# Patient Record
Sex: Male | Born: 1970 | Race: Black or African American | Hispanic: No | Marital: Single | Smoking: Current every day smoker
Health system: Southern US, Community
[De-identification: ages and names within clinical notes are randomized; demographics above are authoritative.]

## PROBLEM LIST (undated history)

## (undated) HISTORY — PX: FOOT SURGERY: SHX648

---

## 2005-01-10 ENCOUNTER — Emergency Department (HOSPITAL_COMMUNITY): Admission: EM | Admit: 2005-01-10 | Discharge: 2005-01-10 | Payer: Self-pay | Admitting: Family Medicine

## 2008-01-29 ENCOUNTER — Ambulatory Visit: Payer: Self-pay | Admitting: Family Medicine

## 2008-01-29 ENCOUNTER — Inpatient Hospital Stay (HOSPITAL_COMMUNITY): Admission: EM | Admit: 2008-01-29 | Discharge: 2008-02-01 | Payer: Self-pay | Admitting: Emergency Medicine

## 2009-02-02 ENCOUNTER — Emergency Department (HOSPITAL_COMMUNITY): Admission: EM | Admit: 2009-02-02 | Discharge: 2009-02-02 | Payer: Self-pay | Admitting: Emergency Medicine

## 2010-11-15 NOTE — H&P (Signed)
NAME:  Jon Schneider, Jon Schneider NO.:  [id number]   MEDICAL RECORD NO.:  [id number]          PATIENT TYPE:  EMS   LOCATION:  MAJO                         FACILITY:  [facility]   PHYSICIAN:  Alvy Beal, MD    DATE OF BIRTH:  1971/02/11   DATE OF ADMISSION:  01/28/2008  DATE OF DISCHARGE:                              HISTORY & PHYSICAL   REASON FOR CONSULTATION:  Right foot injury.   HISTORY:  Jon Schneider is a very pleasant 40 year old gentleman who  yesterday early afternoon was playing basketball.  A large man came,  rebounded the ball, and landed on top of his right foot, and he noted  immediate pain and swelling.  He continued to play and was ambulating on  the foot with increasing difficulty this evening, because of ongoing  pain and swelling, he presented to the emergency room.  He states that  the worst his pain was about 5 or 6.  He never had any numbness, but  just a progressive swelling, had no point that he really has the leg  elevated, he kept in defended position, and he is even still trying to  ambulate on it.  X-rays were taken here in the emergency room and I was  contacted because of the presence of a Lisfranc fracture-dislocation of  the right foot.   At this point in time upon arrival to the emergency room, he was now  anywhere from 36-48 hours status post the injury.   PAST MEDICAL/SURGICAL/FAMILY/SOCIAL HISTORY:  The patient is a  historian.  He provides an excellent history.  He is alert and oriented  x3.  He denies any significant medical problems.  He smokes rarely, he  does use cannabis rarely, and he drinks rarely.   He is on no current medications.   He has no known drug allergies.   A 14-point review of systems is unremarkable.  He has no history of  diabetes, hypertension, high blood pressure, or previous surgeries.   CLINICAL EXAM:  He is currently resting.  The foot is not immobilized.  He has no calf pain or tenderness.  He has no knee  pain with range of  motion or palpation.  Hip is also nontender with gentle, internal, and  external rotation.  He has no shortness of breath or chest pain.  Abdomen is soft and nontender.  No history of incontinence of bowel and  bladder.  Cranial nerves II-XII were tested, all were intact.  Left  lower extremity is benign with no gross deformity, crepitus, or pain  with ankle, knee, or hip range of motion.  Evaluation of the right foot  reveals significant swelling.  I cannot compressive forefoot, midfoot,  and hindfoot and it does not elicit horrific pain.  It is soft, swollen,  but still soft.  I can passively move all five toes without eliciting  significant pain.  His sensation to light touch is intact throughout  both the dorsum and sole of the foot and he is able to move all of his  toes.   Capillary refill is less than 2 seconds in all  digits.  He has a  palpable dorsalis pedis, posterior tibialis pulse.   The skin itself is intact.  There is no significant blistering.  No  evidence of any tenting of the skin.   The x-rays confirm a divergent Lisfranc fracture-dislocation.   PLAN:  At this point, there is no evidence of acute compartment syndrome  of the foot or calf.  He is neurovascularly intact and there is no  evidence of this being an open injury or that the skin is in danger of  opening.  At this point, given the complexity of his fracture, I think  the best course of action is to admit him, immobilize the extremity in a  splint and ice it.  I will discuss with my orthopedic colleagues about  definitive fracture management.  I explained to the patient that this is  very significant injury and it has already been 36-48 hours since the  injury and that recovery from this is difficult.  I explained this to  the patient and to his significant other.  All the questions were  encouraged and addressed.      Alvy Beal, MD  Electronically Signed     DDB/MEDQ  D:   01/29/2008  T:  01/29/2008  Job:  [id number]

## 2010-11-15 NOTE — Op Note (Signed)
NAME:  Jon Schneider, Jon Schneider NO.:  [id number]   MEDICAL RECORD NO.:  [id number]          PATIENT TYPE:  INP   LOCATION:  3307                         FACILITY:  [facility]   PHYSICIAN:  Nadara Mustard, MD     DATE OF BIRTH:  1971-02-06   DATE OF PROCEDURE:  01/30/2008  DATE OF DISCHARGE:                               OPERATIVE REPORT   PREOPERATIVE DIAGNOSIS:  Divergent Lisfranc fracture-dislocation, right  foot.   POSTOPERATIVE DIAGNOSIS:  Divergent Lisfranc fracture-dislocation, right  foot.   PROCEDURE:  Open reduction and internal fixation, right Lisfranc  fracture.   SURGEON:  Nadara Mustard, MD   ANESTHESIA:  General.   ESTIMATED BLOOD LOSS:  Minimal.   ANTIBIOTICS:  1 gram of Kefzol.   DRAINS:  None.   COMPLICATIONS:  None.   TOURNIQUET TIME:  Esmarch at the ankle for approximately 43 minutes.   DISPOSITION:  To PACU in stable condition.   INDICATIONS FOR PROCEDURE:  The patient is a 40 year old gentleman who  sustained a closed Lisfranc fracture-dislocation divergent with the  medial column dislocated and displaced medially and the four lateral  metatarsals divergent laterally.  The patient was initially evaluated  and was neurovascularly intact and no evidence of compartment syndrome.  He was placed in a bulky splint and presents at this time for surgical  intervention.  Risks and benefits were discussed including infection,  neurovascular injury, persistent pain, breakdown of the wounds, and need  for additional surgery.  The patient states that he understands and  wished proceed at this time.   DESCRIPTION OF PROCEDURE:  The patient was brought to the OR room-4 and  underwent general anesthetic.  After adequate level of anesthesia was  obtained, the patient's right lower extremity was prepped using DuraPrep  and draped in a sterile field.  An [name] was used to cover all exposed  skin.  A first dorsal incision was made in the webspace between the  first and second metatarsals.  This was bluntly carried down, first the  base of the first metatarsal and the medial cuneiform.  The joint was  cleansed, reduced, and then stabilized with a screw from distal dorsal  to plantar proximal.  This stabilized the medial column.  Using the  tenaculum clamp, the second metatarsal was reduced and was brought over  to the first metatarsal and a second screw was placed from the medial  cuneiform into the base of the second metatarsal.  Attention was then  focused over the lateral column and a second dorsal incision was made  over the fifth metatarsal.  Blunt dissection was carried down to the  base of the fourth and fifth metatarsals.  These were both reduced and  then the two joints were both each end with a 1.6 mm K-wire to stabilize  the base of the fourth and fifth metatarsals.  Radiographs showed stable  alignment in both AP and lateral planes.  The wounds were irrigated with normal saline.  The skin incision was  closed without tension on the skin with a far-near-near-far suture with  both incisions closed.  The wounds  were covered with Adaptic, orthopedic  sponges, sterile Webril, and Coban dressing.  The patient was extubated  and taken to PACU in stable condition.      Nadara Mustard, MD  Electronically Signed     MVD/MEDQ  D:  01/30/2008  T:  01/31/2008  Job:  [id number]

## 2010-11-18 NOTE — Discharge Summary (Signed)
NAME:  Jon Schneider, Jon Schneider NO.:  [id number]   MEDICAL RECORD NO.:  [id number]          PATIENT TYPE:  INP   LOCATION:  4155                         FACILITY:  [facility]   PHYSICIAN:  Nadara Mustard, MD     DATE OF BIRTH:  04-08-1971   DATE OF ADMISSION:  01/28/2008  DATE OF DISCHARGE:  02/01/2008                               DISCHARGE SUMMARY   FINAL DIAGNOSIS:  Lisfranc fracture-dislocation, right foot.   PROCEDURE:  Open reduction and internal fixation, right foot.   Discharged to home in stable condition.   Follow up in the office in 2 weeks.   HISTORY OF PRESENT ILLNESS:  The patient is a 40 year old gentleman who  sustained a traumatic Lisfranc fracture-dislocation to the right foot.  He was admitted.  Consultation was obtained for possible surgical  intervention, and due to the unstable nature of his foot, the patient  was up brought to the OR for open reduction and internal fixation of the  right Lisfranc fracture.  The patient underwent open reduction and  internal fixation of the right mid foot Lisfranc fracture on January 30, 2008.  He received Kefzol for infection prophylaxis, Esmarch to the  ankle for approximately 43 minutes.  Postoperatively, the patient  progressed well.  He was nonweightbearing on the right.  He was able to  progress with physical therapy where he was able to be nonweightbearing,  and he was discharged to home in stable condition on February 01, 2008,  with followup in the office in 2 weeks.      Nadara Mustard, MD  Electronically Signed     MVD/MEDQ  D:  04/22/2008  T:  04/22/2008  Job:  [phone] [id number]

## 2011-03-31 LAB — BASIC METABOLIC PANEL
BUN: 14
CO2: 29
Calcium: 8.6
Creatinine, Ser: 1.26
GFR calc non Af Amer: >60
Glucose, Bld: 116 — ABNORMAL HIGH
Potassium: 3.5

## 2011-03-31 LAB — DIFFERENTIAL
Basophils Relative: 0
Eosinophils Absolute: 0.2
Eosinophils Relative: 2
Lymphocytes Relative: 18
Lymphs Abs: 2
Monocytes Absolute: 0.8
Monocytes Relative: 7
Neutro Abs: 8.1 — ABNORMAL HIGH

## 2011-03-31 LAB — CBC
HCT: 34.4 — ABNORMAL LOW
Hemoglobin: 11.4 — ABNORMAL LOW
RBC: 4 — ABNORMAL LOW
RBC: 3.73 — ABNORMAL LOW
RDW: 15.3
WBC: 13.1 — ABNORMAL HIGH

## 2011-03-31 LAB — APTT: aPTT: 30

## 2011-03-31 LAB — TYPE AND SCREEN
ABO/RH(D): O POS
Antibody Screen: NEGATIVE

## 2011-03-31 LAB — POCT I-STAT, CHEM 8
HCT: 40
Hemoglobin: 13.6
TCO2: 28

## 2011-03-31 LAB — ABO/RH: ABO/RH(D): O POS

## 2011-03-31 LAB — PROTIME-INR: INR: 1

## 2014-05-26 ENCOUNTER — Encounter (HOSPITAL_COMMUNITY): Payer: Self-pay | Admitting: *Deleted

## 2014-05-26 ENCOUNTER — Emergency Department (HOSPITAL_COMMUNITY)
Admission: EM | Admit: 2014-05-26 | Discharge: 2014-05-26 | Disposition: A | Discharge status: Home / Self Care | Payer: Self-pay | Attending: Emergency Medicine | Admitting: Emergency Medicine

## 2014-05-26 DIAGNOSIS — B353 Tinea pedis: Secondary | ICD-10-CM | POA: Insufficient documentation

## 2014-05-26 DIAGNOSIS — Z9889 Other specified postprocedural states: Secondary | ICD-10-CM | POA: Insufficient documentation

## 2014-05-26 DIAGNOSIS — Z720 Tobacco use: Secondary | ICD-10-CM | POA: Insufficient documentation

## 2014-05-26 MED ORDER — TERBINAFINE HCL 250 MG PO TABS: 250.0000 mg | ORAL_TABLET | Freq: Every day | ORAL | Status: DC

## 2014-05-26 NOTE — Discharge Instructions (Signed)

## 2014-05-26 NOTE — ED Notes (Signed)
C/o constant pain and stinging between 4th & 5th toes on L foot, skin not intact, open wound noted. (denies: known injury, nv, cramping, fever, burning or itching). No meds PTA. Has tried foot powder and Dr. Margart SicklesScholl's anti-fungal foot spray.  Onset ~ 3 weeks ago. Admits to sx worsening after playing in a creek. Wears steel toed shoes at work.

## 2014-05-26 NOTE — ED Provider Notes (Signed)
CSN: [id number]     Arrival date & time 05/26/14  0450 History   First MD Initiated Contact with Patient 05/26/14 [phone]     Chief Complaint  Patient presents with  . Foot Pain     (Consider location/radiation/quality/duration/timing/severity/associated sxs/prior Treatment) HPI  Pt is a 43yo male c/o left foot rash with itching for 3 weeks, gradually worsening.  Pt states he tried foot powder and Dr. Margart SicklesScholl's anti-fungal spay w/o relief.  Pt does admit symptoms worsened after playing in the creek with his son the other day. Pt does wear steel toed shoes.  No hx of diabetes. No injury to foot.  Denies hx of similar symptoms. No fever, n/v/d.   History reviewed. No pertinent past medical history. Past Surgical History  Procedure Laterality Date  . Foot surgery Right    No family history on file. History  Substance Use Topics  . Smoking status: Current Every Day Smoker  . Smokeless tobacco: Not on file  . Alcohol Use: Yes    Review of Systems  Constitutional: Negative for fever and chills.  Musculoskeletal: Positive for joint swelling. Negative for myalgias and arthralgias.       Left 4th and 5th toe swelling   Skin: Positive for color change, rash and wound. Negative for pallor.       Skin between 4th and 5th left toe      Allergies  Review of patient's allergies indicates no known allergies.  Home Medications   Prior to Admission medications   Medication Sig Start Date End Date Taking? Authorizing Provider  terbinafine (LAMISIL) 250 MG tablet Take 1 tablet (250 mg total) by mouth daily. Daily 1 tab daily for 4 weeks 05/26/14   Junius FinnerErin O'Malley, PA-C   BP 122/76 mmHg  Pulse 60  Temp(Src) 97.5 F (36.4 C) (Oral)  Resp 22  Ht 5\' 9"  (1.753 m)  Wt 186 lb (84.369 kg)  BMI 27.45 kg/m2  SpO2 100% Physical Exam  Constitutional: He is oriented to person, place, and time. He appears well-developed and well-nourished.  HENT:  Head: Normocephalic and atraumatic.  Eyes: EOM are  normal.  Neck: Normal range of motion.  Cardiovascular: Normal rate.   Pulmonary/Chest: Effort normal.  Musculoskeletal: Normal range of motion. He exhibits tenderness. He exhibits no edema.  Tenderness between left 4th and 5th toes, see skin exam  Neurological: He is alert and oriented to person, place, and time.  Skin: Skin is warm and dry.  Web spacing between 4th and 5th toes-white, thick, flaking skin, centralized depression in skin. Tenderness. No erythema, warmth or discharge.  Psychiatric: He has a normal mood and affect. His behavior is normal.  Nursing note and vitals reviewed.   ED Course  Procedures (including critical care time) Labs Review Labs Reviewed - No data to display  Imaging Review No results found.   EKG Interpretation None      MDM   Final diagnoses:  Tinea pedis of left foot   Pt c/o left foot itching and rash between 4th and 5th toes, started 3 weeks ago. Rash is consistent with tinea pedis. No evidence of underlying cellulitis or abscess. Will tx with PO terbinafine x4 weeks. Advised pt to f/u with [facility] in 2-3 weeks if not improving, or symptoms continue to worsen. Pt verbalized understanding and agreement with tx plan.     Junius Finnerrin O'Malley, PA-C 05/26/14 [id number]  Loren Raceravid Yelverton, MD 06/03/14 [phone]

## 2015-09-24 ENCOUNTER — Emergency Department (HOSPITAL_COMMUNITY)
Admission: EM | Admit: 2015-09-24 | Discharge: 2015-09-24 | Disposition: A | Discharge status: Home / Self Care | Payer: Self-pay | Attending: Emergency Medicine | Admitting: Emergency Medicine

## 2015-09-24 ENCOUNTER — Encounter (HOSPITAL_COMMUNITY): Payer: Self-pay

## 2015-09-24 ENCOUNTER — Emergency Department (HOSPITAL_COMMUNITY): Payer: No Typology Code available for payment source

## 2015-09-24 DIAGNOSIS — S0592XA Unspecified injury of left eye and orbit, initial encounter: Secondary | ICD-10-CM | POA: Insufficient documentation

## 2015-09-24 DIAGNOSIS — R61 Generalized hyperhidrosis: Secondary | ICD-10-CM | POA: Insufficient documentation

## 2015-09-24 DIAGNOSIS — S4992XA Unspecified injury of left shoulder and upper arm, initial encounter: Secondary | ICD-10-CM | POA: Insufficient documentation

## 2015-09-24 DIAGNOSIS — Y998 Other external cause status: Secondary | ICD-10-CM | POA: Insufficient documentation

## 2015-09-24 DIAGNOSIS — F17210 Nicotine dependence, cigarettes, uncomplicated: Secondary | ICD-10-CM | POA: Insufficient documentation

## 2015-09-24 DIAGNOSIS — Y92410 Unspecified street and highway as the place of occurrence of the external cause: Secondary | ICD-10-CM | POA: Insufficient documentation

## 2015-09-24 DIAGNOSIS — Y9389 Activity, other specified: Secondary | ICD-10-CM | POA: Insufficient documentation

## 2015-09-24 DIAGNOSIS — S0990XA Unspecified injury of head, initial encounter: Secondary | ICD-10-CM | POA: Insufficient documentation

## 2015-09-24 DIAGNOSIS — S199XXA Unspecified injury of neck, initial encounter: Secondary | ICD-10-CM | POA: Insufficient documentation

## 2015-09-24 MED ORDER — IBUPROFEN 600 MG PO TABS: 600.0000 mg | ORAL_TABLET | Freq: Four times a day (QID) | ORAL | Status: DC | PRN

## 2015-09-24 MED ORDER — IBUPROFEN 800 MG PO TABS
800.0000 mg | ORAL_TABLET | Freq: Once | ORAL | Status: AC
  Administered 2015-09-24: 800 mg via ORAL
  Filled 2015-09-24: qty 1

## 2015-09-24 NOTE — Discharge Instructions (Signed)
Ibuprofen for pain. Ice your face several times a day. Follow up with a family doctor. Your CT scans are normal today.   Head Injury, Adult You have received a head injury. It does not appear serious at this time. Headaches and vomiting are common following head injury. It should be easy to awaken from sleeping. Sometimes it is necessary for you to stay in the emergency department for a while for observation. Sometimes admission to the hospital may be needed. After injuries such as yours, most problems occur within the first 24 hours, but side effects may occur up to 7-10 days after the injury. It is important for you to carefully monitor your condition and contact your health care provider or seek immediate medical care if there is a change in your condition. WHAT ARE THE TYPES OF HEAD INJURIES? Head injuries can be as minor as a bump. Some head injuries can be more severe. More severe head injuries include:  A jarring injury to the brain (concussion).  A bruise of the brain (contusion). This mean there is bleeding in the brain that can cause swelling.  A cracked skull (skull fracture).  Bleeding in the brain that collects, clots, and forms a bump (hematoma). WHAT CAUSES A HEAD INJURY? A serious head injury is most likely to happen to someone who is in a car wreck and is not wearing a seat belt. Other causes of major head injuries include bicycle or motorcycle accidents, sports injuries, and falls. HOW ARE HEAD INJURIES DIAGNOSED? A complete history of the event leading to the injury and your current symptoms will be helpful in diagnosing head injuries. Many times, pictures of the brain, such as CT or MRI are needed to see the extent of the injury. Often, an overnight hospital stay is necessary for observation.  WHEN SHOULD I SEEK IMMEDIATE MEDICAL CARE?  You should get help right away if:  You have confusion or drowsiness.  You feel sick to your stomach (nauseous) or have continued, forceful  vomiting.  You have dizziness or unsteadiness that is getting worse.  You have severe, continued headaches not relieved by medicine. Only take over-the-counter or prescription medicines for pain, fever, or discomfort as directed by your health care provider.  You do not have normal function of the arms or legs or are unable to walk.  You notice changes in the black spots in the center of the colored part of your eye (pupil).  You have a clear or bloody fluid coming from your nose or ears.  You have a loss of vision. During the next 24 hours after the injury, you must stay with someone who can watch you for the warning signs. This person should contact local emergency services (911 in the [place] if you have seizures, you become unconscious, or you are unable to wake up. HOW CAN I PREVENT A HEAD INJURY IN THE FUTURE? The most important factor for preventing major head injuries is avoiding motor vehicle accidents. To minimize the potential for damage to your head, it is crucial to wear seat belts while riding in motor vehicles. Wearing helmets while bike riding and playing collision sports (like football) is also helpful. Also, avoiding dangerous activities around the house will further help reduce your risk of head injury.  WHEN CAN I RETURN TO NORMAL ACTIVITIES AND ATHLETICS? You should be reevaluated by your health care provider before returning to these activities. If you have any of the following symptoms, you should not return to activities or  contact sports until 1 week after the symptoms have stopped:  Persistent headache.  Dizziness or vertigo.  Poor attention and concentration.  Confusion.  Memory problems.  Nausea or vomiting.  Fatigue or tire easily.  Irritability.  Intolerant of bright lights or loud noises.  Anxiety or depression.  Disturbed sleep. MAKE SURE YOU:   Understand these instructions.  Will watch your condition.  Will get help right away if you are  not doing well or get worse.   This information is not intended to replace advice given to you by your health care provider. Make sure you discuss any questions you have with your health care provider.   Document Released: 06/19/2005 Document Revised: 07/10/2014 Document Reviewed: 02/24/2013 Elsevier Interactive Patient Education [employer]

## 2015-09-24 NOTE — ED Notes (Signed)
Patient transported to CT 
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
## 2015-09-24 NOTE — ED Notes (Signed)
Bed: [id number] Expected date:  Expected time:  Means of arrival:  Comments: EMS- SOB/Fever/Hx of COPD

## 2015-09-24 NOTE — ED Notes (Signed)
Patient left prior to receiving discharge instructions. 

## 2015-12-04 ENCOUNTER — Emergency Department (HOSPITAL_COMMUNITY): Payer: No Typology Code available for payment source

## 2015-12-04 ENCOUNTER — Encounter (HOSPITAL_COMMUNITY): Payer: Self-pay | Admitting: Nurse Practitioner

## 2015-12-04 ENCOUNTER — Emergency Department (HOSPITAL_COMMUNITY)
Admission: EM | Admit: 2015-12-04 | Discharge: 2015-12-04 | Disposition: A | Discharge status: Home / Self Care | Payer: No Typology Code available for payment source | Attending: Emergency Medicine | Admitting: Emergency Medicine

## 2015-12-04 DIAGNOSIS — Z791 Long term (current) use of non-steroidal anti-inflammatories (NSAID): Secondary | ICD-10-CM | POA: Insufficient documentation

## 2015-12-04 DIAGNOSIS — N503 Cyst of epididymis: Secondary | ICD-10-CM | POA: Insufficient documentation

## 2015-12-04 DIAGNOSIS — Z79899 Other long term (current) drug therapy: Secondary | ICD-10-CM | POA: Insufficient documentation

## 2015-12-04 DIAGNOSIS — R52 Pain, unspecified: Secondary | ICD-10-CM

## 2015-12-04 DIAGNOSIS — F17210 Nicotine dependence, cigarettes, uncomplicated: Secondary | ICD-10-CM | POA: Insufficient documentation

## 2015-12-04 DIAGNOSIS — N453 Epididymo-orchitis: Secondary | ICD-10-CM

## 2015-12-04 DIAGNOSIS — N433 Hydrocele, unspecified: Secondary | ICD-10-CM | POA: Insufficient documentation

## 2015-12-04 DIAGNOSIS — N390 Urinary tract infection, site not specified: Secondary | ICD-10-CM | POA: Insufficient documentation

## 2015-12-04 LAB — URINE MICROSCOPIC-ADD ON

## 2015-12-04 LAB — URINALYSIS, ROUTINE W REFLEX MICROSCOPIC
Bilirubin Urine: NEGATIVE
GLUCOSE, UA: NEGATIVE mg/dL
Ketones, ur: NEGATIVE mg/dL
Nitrite: POSITIVE — AB
Protein, ur: NEGATIVE mg/dL
SPECIFIC GRAVITY, URINE: 1.024 (ref 1.005–1.030)
pH: 5.5 (ref 5.0–8.0)

## 2015-12-04 MED ORDER — CEFTRIAXONE SODIUM 250 MG IJ SOLR
250.0000 mg | Freq: Once | INTRAMUSCULAR | Status: AC
Start: 2015-12-04 — End: 2015-12-04
  Administered 2015-12-04: 250 mg via INTRAMUSCULAR
  Filled 2015-12-04: qty 250

## 2015-12-04 MED ORDER — CEPHALEXIN 250 MG PO CAPS
500.0000 mg | ORAL_CAPSULE | Freq: Once | ORAL | Status: AC
  Administered 2015-12-04: 500 mg via ORAL
  Filled 2015-12-04: qty 2

## 2015-12-04 MED ORDER — CEPHALEXIN 500 MG PO CAPS: 500.0000 mg | ORAL_CAPSULE | Freq: Two times a day (BID) | ORAL | Status: DC

## 2015-12-04 MED ORDER — CEPHALEXIN 250 MG PO CAPS: 500.0000 mg | ORAL_CAPSULE | Freq: Once | ORAL | Status: DC

## 2015-12-04 MED ORDER — KETOROLAC TROMETHAMINE 30 MG/ML IJ SOLN
30.0000 mg | Freq: Once | INTRAMUSCULAR | Status: AC
  Administered 2015-12-04: 30 mg via INTRAMUSCULAR
  Filled 2015-12-04: qty 1

## 2015-12-04 MED ORDER — DOXYCYCLINE HYCLATE 100 MG PO TABS: 100.0000 mg | ORAL_TABLET | Freq: Two times a day (BID) | ORAL | Status: DC

## 2015-12-04 MED ORDER — LIDOCAINE HCL (PF) 1 % IJ SOLN
INTRAMUSCULAR | Status: AC
  Administered 2015-12-04: 1 mL
  Filled 2015-12-04: qty 5

## 2015-12-04 MED ORDER — DOXYCYCLINE HYCLATE 100 MG PO TABS
100.0000 mg | ORAL_TABLET | Freq: Once | ORAL | Status: AC
  Administered 2015-12-04: 100 mg via ORAL
  Filled 2015-12-04: qty 1

## 2015-12-04 NOTE — ED Notes (Signed)
He c/o 2 day history of R testicle pain and swelling. Symptoms increased after lifting heavy objects at work. He denies urinary changes, n/v. He is alert and breathing easily

## 2015-12-04 NOTE — ED Provider Notes (Signed)
CSN: [id number]     Arrival date & time 12/04/15  1654 History   First MD Initiated Contact with Patient 12/04/15 1805     Chief Complaint  Patient presents with  . Testicle Pain    HPI  Patient presents with concern of right testicular pain. Symptoms actually began about 5 days ago, since onset has been persistent, with pain in the right hemiscrotum, right inguinal area. No dysuria, no hematuria. No fever, chills, vomiting. No history of testicular pathology. Patient was well prior to the onset of symptoms. Minimal relief with OTC medication.   History reviewed. No pertinent past medical history. Past Surgical History  Procedure Laterality Date  . Foot surgery Right    History reviewed. No pertinent family history. Social History  Substance Use Topics  . Smoking status: Current Every Day Smoker -- 0.50 packs/day    Types: Cigarettes  . Smokeless tobacco: Never Used  . Alcohol Use: Yes     Comment: occasionally    Review of Systems  Constitutional:       Per HPI, otherwise negative  HENT:       Per HPI, otherwise negative  Respiratory:       Per HPI, otherwise negative  Cardiovascular:       Per HPI, otherwise negative  Gastrointestinal: Positive for abdominal pain. Negative for vomiting.  Endocrine:       Negative aside from HPI  Genitourinary:       Neg aside from HPI   Musculoskeletal:       Per HPI, otherwise negative  Skin: Negative.   Neurological: Negative for syncope.      Allergies  Review of patient's allergies indicates no known allergies.  Home Medications   Prior to Admission medications   Medication Sig Start Date End Date Taking? Authorizing Provider  albuterol (PROVENTIL HFA;VENTOLIN HFA) 108 (90 Base) MCG/ACT inhaler Inhale 2 puffs into the lungs every 6 (six) hours as needed for wheezing or shortness of breath.    Historical Provider, MD  ibuprofen (ADVIL,MOTRIN) 600 MG tablet Take 1 tablet (600 mg total) by mouth every 6 (six) hours as  needed. 09/24/15   Tatyana Kirichenko, PA-C  terbinafine (LAMISIL) 250 MG tablet Take 1 tablet (250 mg total) by mouth daily. Daily 1 tab daily for 4 weeks Patient not taking: Reported on 09/24/2015 05/26/14   Junius Finner, PA-C   BP 143/95 mmHg  Pulse 84  Temp(Src) 97.9 F (36.6 C) (Oral)  Resp 18  SpO2 99% Physical Exam  Constitutional: He is oriented to person, place, and time. He appears well-developed. No distress.  HENT:  Head: Normocephalic and atraumatic.  Eyes: Conjunctivae and EOM are normal.  Cardiovascular: Normal rate and regular rhythm.   Pulmonary/Chest: Effort normal. No stridor. No respiratory distress.  Abdominal: He exhibits no distension.  Genitourinary:     Musculoskeletal: He exhibits no edema.  Neurological: He is alert and oriented to person, place, and time.  Skin: Skin is warm and dry.  Psychiatric: He has a normal mood and affect.  Nursing note and vitals reviewed.   ED Course  Procedures (including critical care time) Labs Review Labs Reviewed  URINALYSIS, ROUTINE W REFLEX MICROSCOPIC (NOT AT [facility]) - Abnormal; Notable for the following:    APPearance CLOUDY (*)    Hgb urine dipstick MODERATE (*)    Nitrite POSITIVE (*)    Leukocytes, UA LARGE (*)    All other components within normal limits  URINE MICROSCOPIC-ADD ON - Abnormal; Notable for  the following:    Squamous Epithelial / LPF 0-5 (*)    Bacteria, UA MANY (*)    All other components within normal limits    Imaging Review [place] Scrotum  12/04/2015  CLINICAL DATA:  Acute onset of right testicular pain. Initial encounter. EXAM: SCROTAL ULTRASOUND DOPPLER ULTRASOUND OF THE TESTICLES TECHNIQUE: Complete ultrasound examination of the testicles, epididymis, and other scrotal structures was performed. Color and spectral Doppler ultrasound were also utilized to evaluate blood flow to the testicles. COMPARISON:  None. FINDINGS: Right testicle Measurements: 4.2 x 3.1 x 3.4 cm. No mass or microlithiasis  visualized. Left testicle Measurements: 3.6 x 2.7 x 3.4 cm. No mass or microlithiasis visualized. Right epididymis: A 6 mm cyst is noted at the right epididymis. There appears to be diffusely increased blood flow at the right epididymis. Left epididymis:  Normal in size and appearance. Hydrocele: Moderate to large right and small left hydroceles are seen. Varicocele:  None visualized. Pulsed Doppler interrogation of the left testis demonstrates normal low resistance arterial and venous waveforms. There is diffusely increased blood flow noted to the right testis, concerning for orchitis. IMPRESSION: 1. Diffuse hyperemia with regard to the right testis and right epididymis, concerning for right-sided orchitis and epididymitis. 2. Moderate to large right and small left hydroceles seen. 3. No evidence of testicular torsion. 4. 6 mm right epididymal cyst noted. Electronically Signed   By: Roanna RaiderJeffery  Chang M.D.   On: 12/04/2015 19:11   [place] Art/ven Flow Abd Pelv Doppler  12/04/2015  CLINICAL DATA:  Acute onset of right testicular pain. Initial encounter. EXAM: SCROTAL ULTRASOUND DOPPLER ULTRASOUND OF THE TESTICLES TECHNIQUE: Complete ultrasound examination of the testicles, epididymis, and other scrotal structures was performed. Color and spectral Doppler ultrasound were also utilized to evaluate blood flow to the testicles. COMPARISON:  None. FINDINGS: Right testicle Measurements: 4.2 x 3.1 x 3.4 cm. No mass or microlithiasis visualized. Left testicle Measurements: 3.6 x 2.7 x 3.4 cm. No mass or microlithiasis visualized. Right epididymis: A 6 mm cyst is noted at the right epididymis. There appears to be diffusely increased blood flow at the right epididymis. Left epididymis:  Normal in size and appearance. Hydrocele: Moderate to large right and small left hydroceles are seen. Varicocele:  None visualized. Pulsed Doppler interrogation of the left testis demonstrates normal low resistance arterial and venous waveforms.  There is diffusely increased blood flow noted to the right testis, concerning for orchitis. IMPRESSION: 1. Diffuse hyperemia with regard to the right testis and right epididymis, concerning for right-sided orchitis and epididymitis. 2. Moderate to large right and small left hydroceles seen. 3. No evidence of testicular torsion. 4. 6 mm right epididymal cyst noted. Electronically Signed   By: Roanna RaiderJeffery  Chang M.D.   On: 12/04/2015 19:11   I have personally reviewed and evaluated these images and lab results as part of my medical decision-making.    MDM  Young male presents with scrotal pain. Patient is awake, alert, afebrile. Evaluation consistent with epididymo orchitis w UTI. Patient treated with ceftriaxone, doxycycline, analgesia, discharged in stable condition.  Gerhard Munchobert Travelle Mcclimans, MD 12/04/15 2046

## 2015-12-04 NOTE — ED Notes (Signed)
Pt verbalized understanding of antibiotic use and tofollow up with urologist and has no further questions. Pt stable and NAD

## 2015-12-07 LAB — URINE CULTURE

## 2015-12-08 ENCOUNTER — Telehealth: Payer: Self-pay | Admitting: *Deleted

## 2015-12-08 NOTE — ED Notes (Signed)
Post ED Visit - Positive Culture Follow-up  Culture report reviewed by antimicrobial stewardship pharmacist:  []  Enzo BiNathan Batchelder, Pharm.D. []  Celedonio MiyamotoJeremy Frens, Pharm.D., BCPS []  Garvin FilaMike Maccia, Pharm.D. []  Georgina PillionElizabeth Martin, Pharm.D., BCPS []  [place] PalmasMinh Pham, [address]Ph[address]D., BCPS, AAHIVP []  Estella HuskMichelle Turner, Pharm.D., BCPS, AAHIVP [x]  Tennis Mustassie Stewart, Pharm.D. []  Sherle Poeob Vincent, [address]Pharm[address]  Positive urine culture Treated with Cephalexin and Doxycycline, organism sensitive to the same and no further patient follow-up is required at this time.  Virl AxeRobertson, [facility] [place] 12/08/2015, 10:06 AM

## 2017-04-11 IMAGING — US US ART/VEN ABD/PELV/SCROTUM DOPPLER LTD
1 series · 13 of 25 positions shown · non-contrast
Comparison: None.

CLINICAL DATA: Acute onset of right testicular pain. Initial
encounter.

EXAM:
SCROTAL ULTRASOUND
DOPPLER ULTRASOUND OF THE TESTICLES
TECHNIQUE: Complete ultrasound examination of the testicles, epididymis, and
other scrotal structures was performed. Color and spectral Doppler
ultrasound were also utilized to evaluate blood flow to the
testicles.

[Series 1: us art/ven abd/pelv/scrotum doppler ltd · 0.08mm/px · 13 of 49 slices shown]
[im 1/49]
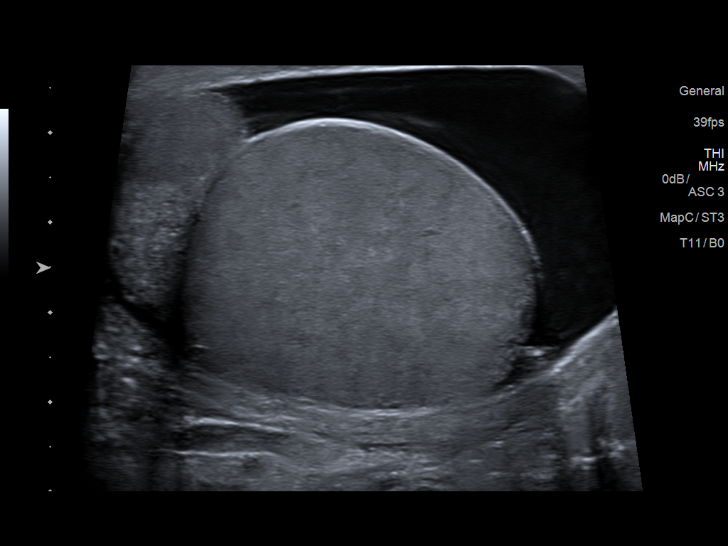
[im 5/49]
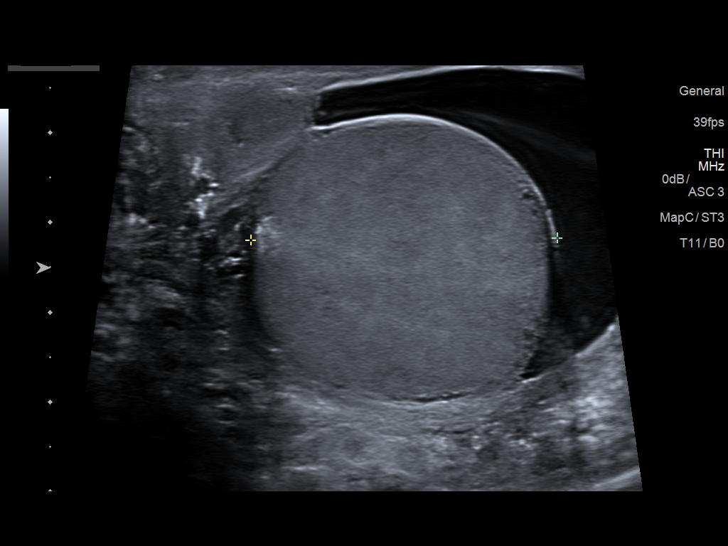
[im 9/49]
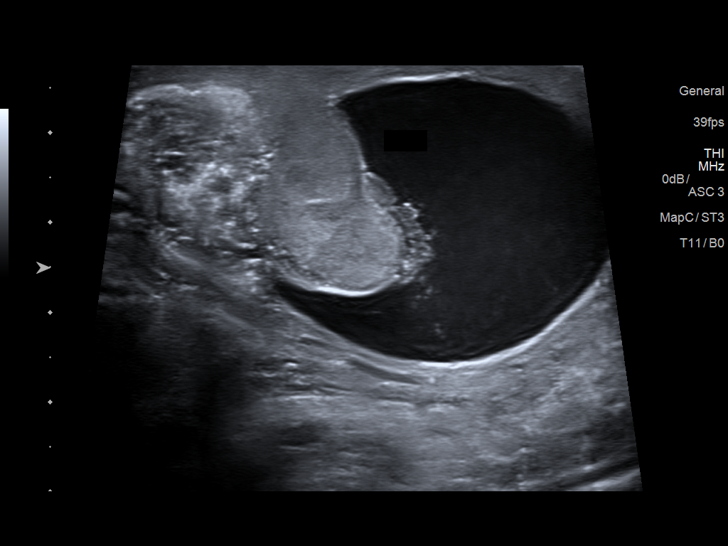
[im 13/49]
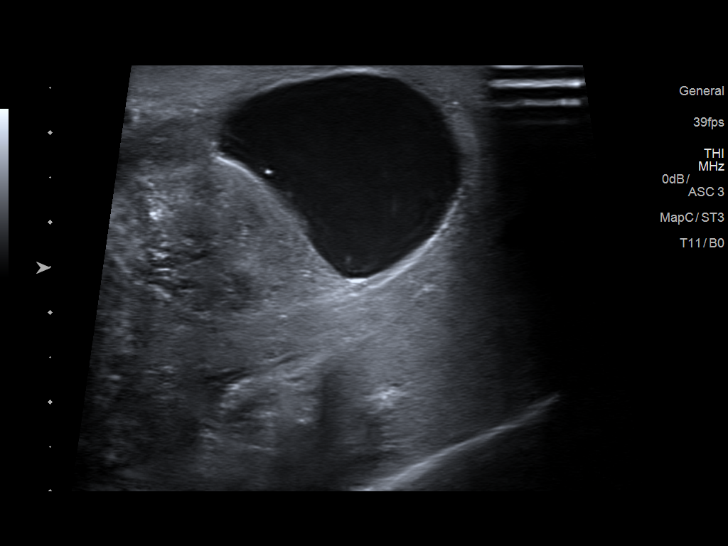
[im 17/49]
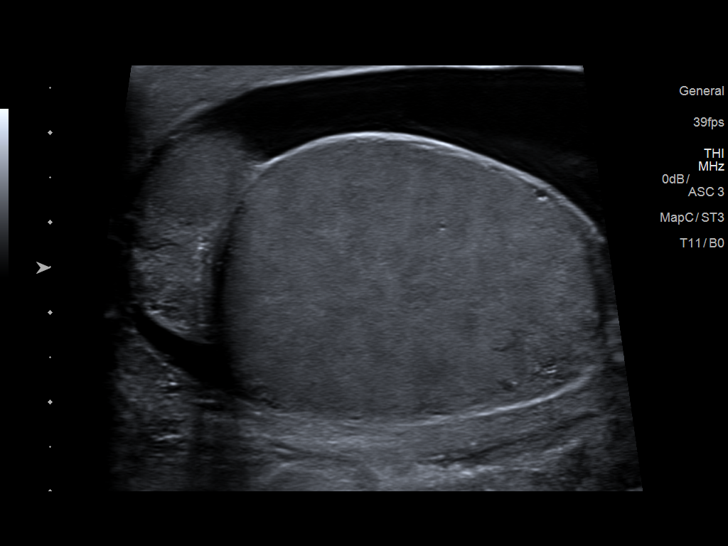
[im 21/49]
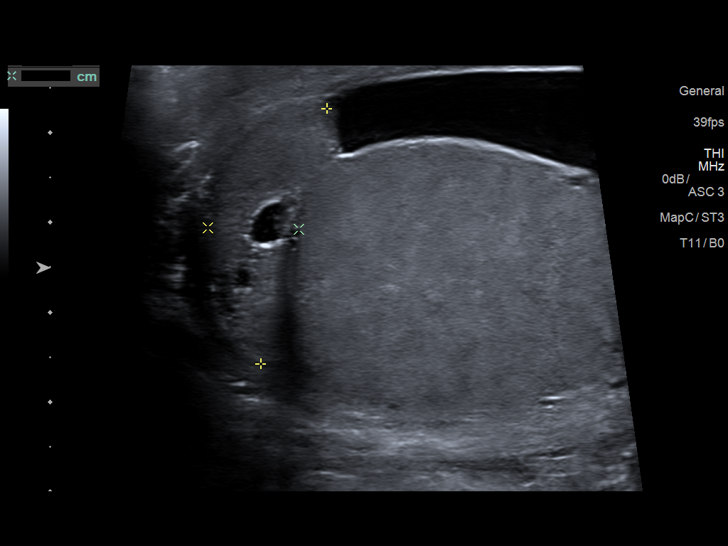
[im 25/49]
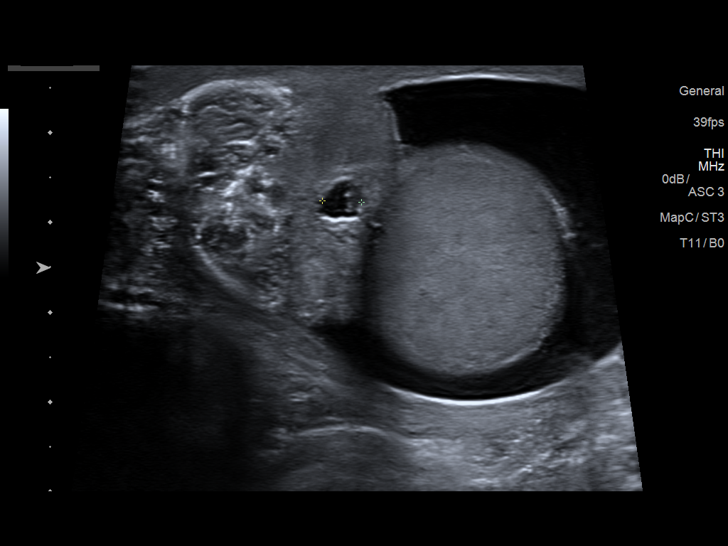
[im 29/49]
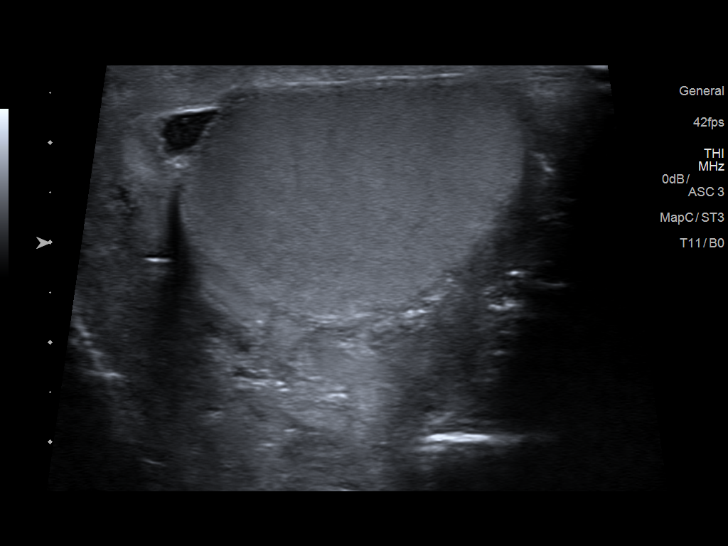
[im 33/49]
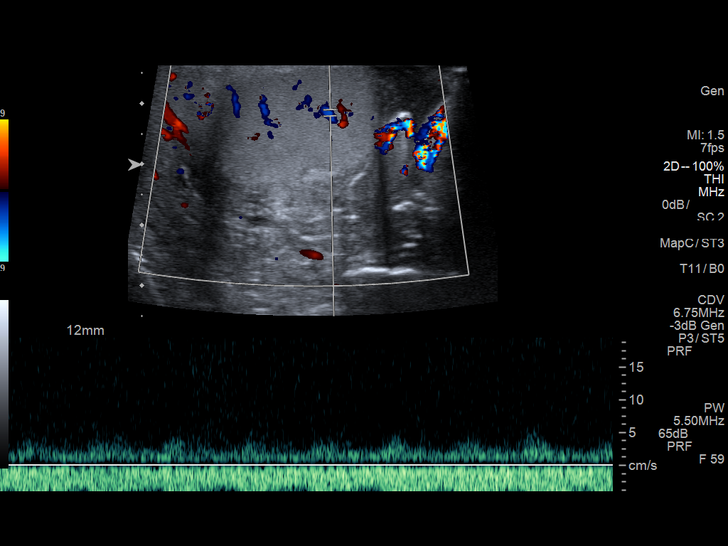
[im 37/49]
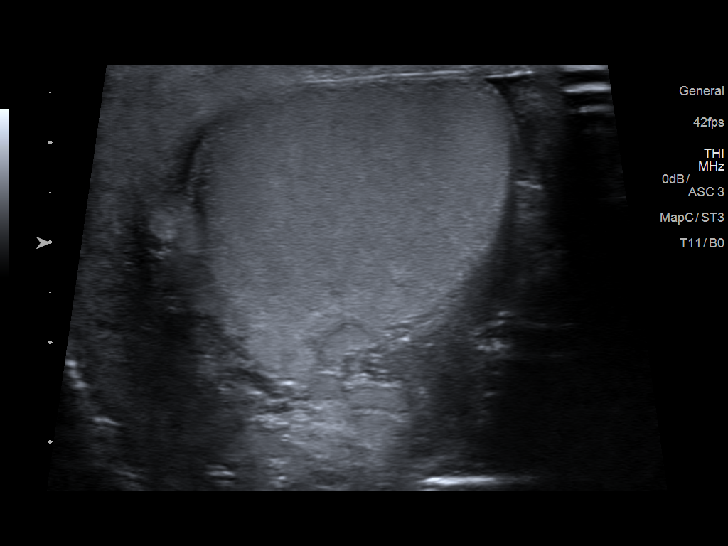
[im 41/49]
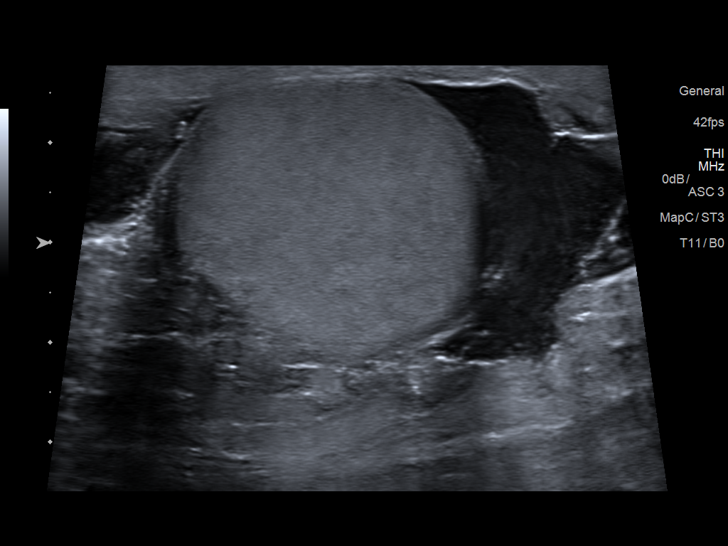
[im 45/49]
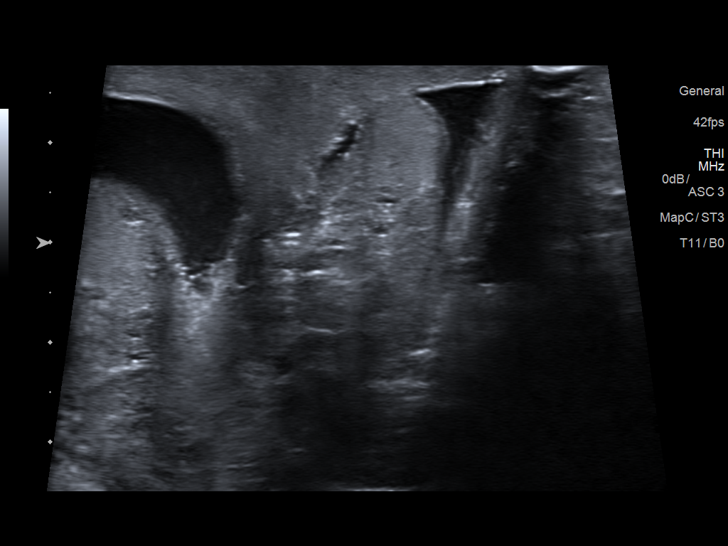
[im 49/49]
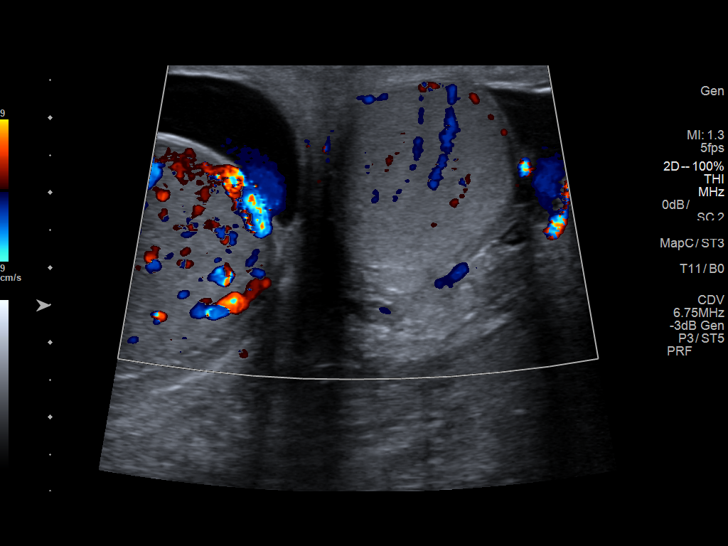

[13 of 25 positions shown; findings below may reference images not displayed]

FINDINGS: Right testicle

Measurements: 4.2 x 3.1 x 3.4 cm. No mass or microlithiasis
visualized.

Left testicle

Measurements: 3.6 x 2.7 x 3.4 cm. No mass or microlithiasis
visualized.

Right epididymis: A 6 mm cyst is noted at the right epididymis.
There appears to be diffusely increased blood flow at the right
epididymis.

Left epididymis:  Normal in size and appearance.

Hydrocele: Moderate to large right and small left hydroceles are
seen.

Varicocele:  None visualized.

Pulsed Doppler interrogation of the left testis demonstrates normal
low resistance arterial and venous waveforms. There is diffusely
increased blood flow noted to the right testis, concerning for
orchitis.
IMPRESSION: 1. Diffuse hyperemia with regard to the right testis and right
epididymis, concerning for right-sided orchitis and epididymitis.
2. Moderate to large right and small left hydroceles seen.
3. No evidence of testicular torsion.
4. 6 mm right epididymal cyst noted.

## 2022-04-19 ENCOUNTER — Ambulatory Visit (INDEPENDENT_AMBULATORY_CARE_PROVIDER_SITE_OTHER): Payer: BC Managed Care – PPO

## 2022-04-19 ENCOUNTER — Encounter (HOSPITAL_COMMUNITY): Payer: Self-pay | Admitting: Emergency Medicine

## 2022-04-19 ENCOUNTER — Ambulatory Visit (HOSPITAL_COMMUNITY)
Admission: EM | Admit: 2022-04-19 | Discharge: 2022-04-19 | Disposition: A | Discharge status: Home / Self Care | Payer: BC Managed Care – PPO | Attending: Internal Medicine | Admitting: Internal Medicine

## 2022-04-19 ENCOUNTER — Other Ambulatory Visit: Payer: Self-pay

## 2022-04-19 DIAGNOSIS — B353 Tinea pedis: Secondary | ICD-10-CM | POA: Diagnosis not present

## 2022-04-19 DIAGNOSIS — M79672 Pain in left foot: Secondary | ICD-10-CM | POA: Diagnosis not present

## 2022-04-19 NOTE — ED Provider Notes (Signed)
[facility]    CSN: [id number] Arrival date & time: 04/19/22  1117      History   Chief Complaint Chief Complaint  Patient presents with   Toe Pain    HPI Jon Schneider is a 51 y.o. male.   Patient presents to urgent care for evaluation of wound to the left foot that started approximately 2 months ago.  Wound is in between the fourth and fifth toes on the left foot and has started to "burrow" into the soft tissue per patient.  He went to the nurse at his job for evaluation of his wound who advised him to come to urgent care to be seen for possible infection.  Patient does not receive routine medical care and states that he does not have diabetes, high blood pressure, or any other chronic medical problems.  He denies fever/chills, unintentional recent weight loss, night sweats, fatigue, polydipsia, polyuria, and numbness and tingling to the bilateral lower extremities.  He denies recent falls, weakness, and dizziness.  He has never had this type of infection in the past.  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  He has attempted use of over-the-counter antifungal creams and foot sprays without relief of symptoms.  The area is mildly painful but patient is able to walk without a limp.  Denies warmth and swelling to the area.     History reviewed. No pertinent past medical history.  There are no problems to display for this patient.   Past Surgical History:  Procedure Laterality Date   FOOT SURGERY Right        Home Medications    Prior to Admission medications   Medication Sig Start Date End Date Taking? Authorizing Provider  albuterol (PROVENTIL HFA;VENTOLIN HFA) 108 (90 Base) MCG/ACT inhaler Inhale 2 puffs into the lungs every 6 (six)  hours as needed for wheezing or shortness of breath.    [provider]  cephALEXin (KEFLEX) 500 MG capsule Take 1 capsule (500 mg total) by mouth 2 (two) times daily. Patient not taking: Reported on 04/19/2022 12/04/15   Gerhard Munch, MD  doxycycline (VIBRA-TABS) 100 MG tablet Take 1 tablet (100 mg total) by mouth 2 (two) times daily. Patient not taking: Reported on 04/19/2022 12/04/15   Gerhard Munch, MD  ibuprofen (ADVIL,MOTRIN) 600 MG tablet Take 1 tablet (600 mg total) by mouth every 6 (six) hours as needed. 09/24/15   Kirichenko, Tatyana, PA-C  terbinafine (LAMISIL) 250 MG tablet Take 1 tablet (250 mg total) by mouth daily. Daily 1 tab daily for 4 weeks Patient not taking: Reported on 09/24/2015 05/26/14   Rolla Plate    Family History History reviewed. No pertinent family history.  Social History Social History   Tobacco Use   Smoking status: Every Day    Packs/day: 0.50    Types: Cigarettes   Smokeless tobacco: Never  Substance Use Topics   Alcohol use: Yes    Comment: occasionally   Drug use: Yes    Types: Marijuana     Allergies   Patient has no known allergies.   Review of Systems Review of Systems Per HPI  Physical Exam Triage Vital Signs ED Triage Vitals  Enc Vitals Group     BP 04/19/22  1214 (!) 138/104     Pulse Rate 04/19/22 1214 60     Resp 04/19/22 1214 18     Temp 04/19/22 1214 98.1 F (36.7 C)     Temp Source 04/19/22 1214 Oral     SpO2 04/19/22 1214 100 %     Weight --      Height --      Head Circumference --      Peak Flow --      Pain Score 04/19/22 1212 7     Pain Loc --      Pain Edu? --      Excl. in [place]? --    No data found.  Updated Vital Signs BP (!) 132/94 (BP Location: Left Arm)   Pulse 60   Temp 98.1 F (36.7 C) (Oral)   Resp 18   SpO2 100%   Visual Acuity Right Eye Distance:   Left Eye Distance:   Bilateral Distance:    Right Eye Near:   Left Eye Near:    Bilateral Near:     Physical  Exam Vitals and nursing note reviewed.  Constitutional:      Appearance: He is not ill-appearing or toxic-appearing.  HENT:     Head: Normocephalic and atraumatic.     Right Ear: Hearing and external ear normal.     Left Ear: Hearing and external ear normal.     Nose: Nose normal.     Mouth/Throat:     Lips: Pink.  Eyes:     General: Lids are normal. Vision grossly intact. Gaze aligned appropriately.     Extraocular Movements: Extraocular movements intact.     Conjunctiva/sclera: Conjunctivae normal.  Cardiovascular:     Rate and Rhythm: Normal rate and regular rhythm.     Heart sounds: Normal heart sounds, S1 normal and S2 normal.  Pulmonary:     Effort: Pulmonary effort is normal. No respiratory distress.     Breath sounds: Normal breath sounds and air entry.  Musculoskeletal:     Cervical back: Neck supple.  Skin:    General: Skin is warm and dry.     Capillary Refill: Capillary refill takes less than 2 seconds.     Findings: Wound present. No rash.     Comments: Wound present in between the fourth and fifth digits to the left foot as seen below in image.  Mild pain with palpation of the wound.  No significant drainage or erythema.  Neurological:     General: No focal deficit present.     Mental Status: He is alert and oriented to person, place, and time. Mental status is at baseline.     Cranial Nerves: No dysarthria or facial asymmetry.  Psychiatric:        Mood and Affect: Mood normal.        Speech: Speech normal.        Behavior: Behavior normal.        Thought Content: Thought content normal.        Judgment: Judgment normal.              UC Treatments / Results  Labs (all labs ordered are listed, but only abnormal results are displayed) Labs Reviewed - No data to display  EKG   Radiology DG Foot Complete Left  Result Date: 04/19/2022 CLINICAL DATA:  Infection between the fourth and fifth toes. EXAM: LEFT FOOT - COMPLETE 3+ VIEW COMPARISON:  None  Available. FINDINGS: No acute fracture, dislocation, or  destructive osseous process is identified. Mild marginal spurring is noted at the first MTP joint, and there is also mild dorsal spurring in the midfoot. No soft tissue emphysema or radiopaque foreign body is identified. IMPRESSION: No acute osseous abnormality identified. Electronically Signed   By: Logan Bores M.D.   On: 04/19/2022 13:22    Procedures Procedures (including critical care time)  Medications Ordered in UC Medications - No data to display  Initial Impression / Assessment and Plan / UC Course  I have reviewed the triage vital signs and the nursing notes.  Pertinent labs & imaging results that were available during my care of the patient were reviewed by me and considered in my medical decision making (see chart for details).   1.  Tinea pedis of the left foot Patient's left foot wound appears to be chronic in nature and is nondraining/erythematous at this time.  Wound is stable and therefore we will not provide oral or topical antifungal therapies at this time.  Instead, we will refer to [facility] for further evaluation and management of chronic wound.  Ambulatory referral to podiatry placed.  Patient given information to call if he does not hear from podiatry in the next 2 to 3 days to schedule an appointment.  Advised to continue wearing sweat wicking socks and allow the wound to be open to air as much as possible to reduce irritation.  PCP assistance initiated as patient has not been seen by PCP in many years.  He is not exhibiting any signs or symptoms consistent with diabetes at this time and therefore we will defer CBG monitoring and blood work at today's visit.  His vital signs are hemodynamically stable and he is clinically well-appearing.  Patient expresses agreement with this plan.   Discussed physical exam and available lab work findings in clinic with patient.  Counseled patient regarding appropriate use of  medications and potential side effects for all medications recommended or prescribed today. Discussed red flag signs and symptoms of worsening condition,when to call the PCP office, return to urgent care, and when to seek higher level of care in the emergency department. Patient verbalizes understanding and agreement with plan. All questions answered. Patient discharged in stable condition.    Final Clinical Impressions(s) / UC Diagnoses   Final diagnoses:  Tinea pedis of left foot     Discharge Instructions      Your x-rays were negative for acute bony abnormality.  I have placed a referral to [facility] for them to help you further as your wound appears to be a chronic wound.  [facility] should be calling you in the next 2 to 3 days to schedule an appointment to be seen.  Continue to wear sweat wicking socks and allow the wound to be open to air is much as possible.   To find a primary care provider go to [email or web address] and follow the prompts to schedule a new patient appointment for primary care.  Someone from [facility] health will be reaching out to you either via MyChart or phone call to help you set up a primary care provider appointment as well.  It is very important to have a primary care provider to manage your overall health and prevent/manage chronic medical conditions.   Return to urgent care as needed.     ED Prescriptions   None    PDMP not reviewed this encounter.   Talbot Grumbling, [place] 04/19/22 1359

## 2022-04-19 NOTE — Discharge Instructions (Addendum)
Your x-rays were negative for acute bony abnormality.  I have placed a referral to [facility] for them to help you further as your wound appears to be a chronic wound.  [facility] should be calling you in the next 2 to 3 days to schedule an appointment to be seen.  Continue to wear sweat wicking socks and allow the wound to be open to air is much as possible.   To find a primary care provider go to [email or web address] and follow the prompts to schedule a new patient appointment for primary care.  Someone from [facility] health will be reaching out to you either via MyChart or phone call to help you set up a primary care provider appointment as well.  It is very important to have a primary care provider to manage your overall health and prevent/manage chronic medical conditions.   Return to urgent care as needed.

## 2022-04-19 NOTE — ED Triage Notes (Signed)
Reports left little toe started having issues 2 weeks ago.  Has changed boots, tried over the counter athletes foot, antifungal cream.  No improvement, actually worsened.  Little toe is red and swollen.  There is a "hole" or patient describes a "burrowing" between last 2 toes on left foot.    Patient is not a diabetic.   Patient does not have a pcp.    Patient works in Research scientist (medical)".  Patient also has concerns for weight loss.

## 2022-04-27 ENCOUNTER — Ambulatory Visit (INDEPENDENT_AMBULATORY_CARE_PROVIDER_SITE_OTHER): Payer: BC Managed Care – PPO | Admitting: Podiatry

## 2022-04-27 ENCOUNTER — Encounter: Payer: Self-pay | Admitting: Podiatry

## 2022-04-27 DIAGNOSIS — B353 Tinea pedis: Secondary | ICD-10-CM

## 2022-04-27 DIAGNOSIS — L84 Corns and callosities: Secondary | ICD-10-CM | POA: Diagnosis not present

## 2022-04-27 MED ORDER — TERBINAFINE HCL 250 MG PO TABS: 250.0000 mg | ORAL_TABLET | Freq: Every day | ORAL | 0 refills | Status: DC

## 2022-04-27 MED ORDER — AMOXICILLIN-POT CLAVULANATE 875-125 MG PO TABS: 1.0000 | ORAL_TABLET | Freq: Two times a day (BID) | ORAL | 0 refills | Status: DC

## 2022-04-27 MED ORDER — NYSTATIN 100000 UNIT/GM EX POWD: 1.0000 | Freq: Three times a day (TID) | CUTANEOUS | 0 refills | Status: DC

## 2022-04-27 NOTE — Progress Notes (Signed)
  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 Now it's becoming an ulcer    51 y.o. male presents with the above complaint. History confirmed with patient.   Objective:  Physical Exam: warm, good capillary refill, no trophic changes or ulcerative lesions, normal DP and PT pulses, normal sensory exam, and interdigital tinea pedis with heloma molle on fourth interspace left foot.  No deep skin breakdown or ulceration  Assessment:   1. Tinea pedis, left   2. Heloma molle      Plan:  Patient was evaluated and treated and all questions answered.  Discussed the etiology and treatment options for tinea pedis.  Discussed topical and oral treatment.  I do think he has some bacterial superinfection.  Recommended treatment with oral terbinafine and Augmentin both were sent to pharmacy.  Discussed foot hygiene and use of antifungal spray.  Rx for nystatin powder also was sent to the patient's pharmacy.  Also discussed appropriate foot hygiene, use of antifungal spray such as Tinactin in shoes, as well as cleaning her foot surfaces such as showers and bathroom floors with bleach.  We discussed the option of a syndactylization if this continues to recur or does not improve.  He will return as needed if it worsens and advised it may take up to a month before this to fully resolve  Return if symptoms worsen or fail to improve.

## 2022-05-22 ENCOUNTER — Emergency Department (HOSPITAL_COMMUNITY): Payer: BC Managed Care – PPO

## 2022-05-22 ENCOUNTER — Other Ambulatory Visit: Payer: Self-pay

## 2022-05-22 ENCOUNTER — Emergency Department (HOSPITAL_COMMUNITY)
Admission: EM | Admit: 2022-05-22 | Discharge: 2022-05-22 | Disposition: A | Discharge status: Home / Self Care | Payer: BC Managed Care – PPO | Attending: Emergency Medicine | Admitting: Emergency Medicine

## 2022-05-22 DIAGNOSIS — S61411A Laceration without foreign body of right hand, initial encounter: Secondary | ICD-10-CM | POA: Diagnosis not present

## 2022-05-22 DIAGNOSIS — Y92410 Unspecified street and highway as the place of occurrence of the external cause: Secondary | ICD-10-CM | POA: Insufficient documentation

## 2022-05-22 DIAGNOSIS — S41111A Laceration without foreign body of right upper arm, initial encounter: Secondary | ICD-10-CM

## 2022-05-22 DIAGNOSIS — S01511A Laceration without foreign body of lip, initial encounter: Secondary | ICD-10-CM | POA: Diagnosis not present

## 2022-05-22 DIAGNOSIS — Y908 Blood alcohol level of 240 mg/100 ml or more: Secondary | ICD-10-CM | POA: Insufficient documentation

## 2022-05-22 DIAGNOSIS — S65301A Unspecified injury of deep palmar arch of right hand, initial encounter: Secondary | ICD-10-CM | POA: Diagnosis present

## 2022-05-22 DIAGNOSIS — M542 Cervicalgia: Secondary | ICD-10-CM | POA: Diagnosis not present

## 2022-05-22 DIAGNOSIS — Z23 Encounter for immunization: Secondary | ICD-10-CM | POA: Diagnosis not present

## 2022-05-22 LAB — CBC
HCT: 41.8 % (ref 39.0–52.0)
Hemoglobin: 13.4 g/dL (ref 13.0–17.0)
MCH: 30.4 pg (ref 26.0–34.0)
MCHC: 32.1 g/dL (ref 30.0–36.0)
MCV: 94.8 fL (ref 80.0–100.0)
Platelets: 172 10*3/uL (ref 150–400)
RBC: 4.41 MIL/uL (ref 4.22–5.81)
RDW: 14.7 % (ref 11.5–15.5)
WBC: 9.3 10*3/uL (ref 4.0–10.5)
nRBC: 0 % (ref 0.0–0.2)

## 2022-05-22 LAB — URINALYSIS, ROUTINE W REFLEX MICROSCOPIC
Bilirubin Urine: NEGATIVE
Glucose, UA: NEGATIVE mg/dL
Hgb urine dipstick: NEGATIVE
Ketones, ur: NEGATIVE mg/dL
Leukocytes,Ua: NEGATIVE
Nitrite: NEGATIVE
Protein, ur: NEGATIVE mg/dL
Specific Gravity, Urine: 1.017 (ref 1.005–1.030)
pH: 6 (ref 5.0–8.0)

## 2022-05-22 LAB — ETHANOL: Alcohol, Ethyl (B): 261 mg/dL — ABNORMAL HIGH (ref <10)

## 2022-05-22 LAB — COMPREHENSIVE METABOLIC PANEL
ALT: 25 U/L (ref 0–44)
AST: 29 U/L (ref 15–41)
Albumin: 4.1 g/dL (ref 3.5–5.0)
Alkaline Phosphatase: 48 U/L (ref 38–126)
Anion gap: 13 (ref 5–15)
BUN: 12 mg/dL (ref 6–20)
CO2: 22 mmol/L (ref 22–32)
Calcium: 8.9 mg/dL (ref 8.9–10.3)
Chloride: 107 mmol/L (ref 98–111)
Creatinine, Ser: 1.39 mg/dL — ABNORMAL HIGH (ref 0.61–1.24)
GFR, Estimated: >60 mL/min (ref >60)
Glucose, Bld: 95 mg/dL (ref 70–99)
Potassium: 3.4 mmol/L — ABNORMAL LOW (ref 3.5–5.1)
Sodium: 142 mmol/L (ref 135–145)
Total Bilirubin: 0.5 mg/dL (ref 0.3–1.2)
Total Protein: 7.2 g/dL (ref 6.5–8.1)

## 2022-05-22 LAB — PROTIME-INR
INR: 0.9 (ref 0.8–1.2)
Prothrombin Time: 12.5 seconds (ref 11.4–15.2)

## 2022-05-22 LAB — I-STAT CHEM 8, ED
BUN: 12 mg/dL (ref 6–20)
Calcium, Ion: 1 mmol/L — ABNORMAL LOW (ref 1.15–1.40)
Chloride: 106 mmol/L (ref 98–111)
Creatinine, Ser: 1.7 mg/dL — ABNORMAL HIGH (ref 0.61–1.24)
Glucose, Bld: 89 mg/dL (ref 70–99)
HCT: 43 % (ref 39.0–52.0)
Hemoglobin: 14.6 g/dL (ref 13.0–17.0)
Potassium: 3.3 mmol/L — ABNORMAL LOW (ref 3.5–5.1)
Sodium: 142 mmol/L (ref 135–145)
TCO2: 22 mmol/L (ref 22–32)

## 2022-05-22 LAB — SAMPLE TO BLOOD BANK

## 2022-05-22 LAB — LACTIC ACID, PLASMA: Lactic Acid, Venous: 2.1 mmol/L (ref 0.5–1.9)

## 2022-05-22 MED ORDER — IOHEXOL 350 MG/ML SOLN
75.0000 mL | Freq: Once | INTRAVENOUS | Status: AC | PRN
  Administered 2022-05-22: 75 mL via INTRAVENOUS

## 2022-05-22 MED ORDER — LIDOCAINE HCL (PF) 1 % IJ SOLN
30.0000 mL | Freq: Once | INTRAMUSCULAR | Status: DC
  Filled 2022-05-22: qty 30

## 2022-05-22 MED ORDER — TETANUS-DIPHTH-ACELL PERTUSSIS 5-2.5-18.5 LF-MCG/0.5 IM SUSY
0.5000 mL | PREFILLED_SYRINGE | Freq: Once | INTRAMUSCULAR | Status: AC
  Administered 2022-05-22: 0.5 mL via INTRAMUSCULAR
  Filled 2022-05-22: qty 0.5
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
## 2022-05-22 NOTE — Discharge Instructions (Signed)
CT's today did not show any internal injuries. Lacerations on your lip and right arm were repaired.  Would recommend eating soft diet for the next 24-48 hours until your lip heals a bit. Sutures will need to be removed in 7-10 days-- your primary care doctor or urgent care can do this for you. Can take tylenol or motrin as needed for pain. Return here for new concerns.
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
## 2022-05-22 NOTE — Progress Notes (Signed)
Orthopedic Tech Progress Note Patient Details:  Jon Schneider January 15, 1971 [id number]  Patient ID: [name], male   DOB: [birth year], 51 y.o.   MRN: [id number] I attended trauma page [place] Post 05/22/2022, 6:41 AM
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
## 2022-05-23 ENCOUNTER — Emergency Department (HOSPITAL_COMMUNITY)
Admission: EM | Admit: 2022-05-23 | Discharge: 2022-05-23 | Discharge status: Against Medical Advice | Payer: BC Managed Care – PPO

## 2022-05-23 NOTE — ED Notes (Signed)
No answer for triage.

## 2022-05-23 NOTE — ED Notes (Signed)
No answer for triage x2 

## 2022-05-24 ENCOUNTER — Ambulatory Visit (HOSPITAL_COMMUNITY)
Admission: EM | Admit: 2022-05-24 | Discharge: 2022-05-24 | Disposition: A | Discharge status: Home / Self Care | Payer: BC Managed Care – PPO | Attending: Sports Medicine | Admitting: Sports Medicine

## 2022-05-24 ENCOUNTER — Encounter (HOSPITAL_COMMUNITY): Payer: Self-pay

## 2022-05-24 DIAGNOSIS — S161XXD Strain of muscle, fascia and tendon at neck level, subsequent encounter: Secondary | ICD-10-CM | POA: Diagnosis not present

## 2022-05-24 MED ORDER — METHYLPREDNISOLONE SODIUM SUCC 125 MG IJ SOLR
INTRAMUSCULAR | Status: AC
  Filled 2022-05-24: qty 2

## 2022-05-24 MED ORDER — KETOROLAC TROMETHAMINE 30 MG/ML IJ SOLN
INTRAMUSCULAR | Status: AC
  Filled 2022-05-24: qty 1

## 2022-05-24 MED ORDER — KETOROLAC TROMETHAMINE 30 MG/ML IJ SOLN
30.0000 mg | Freq: Once | INTRAMUSCULAR | Status: AC
  Administered 2022-05-24: 30 mg via INTRAMUSCULAR

## 2022-05-24 MED ORDER — METHYLPREDNISOLONE SODIUM SUCC 125 MG IJ SOLR
80.0000 mg | Freq: Once | INTRAMUSCULAR | Status: AC
  Administered 2022-05-24: 80 mg via INTRAMUSCULAR
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
## 2022-05-31 ENCOUNTER — Encounter (HOSPITAL_COMMUNITY): Payer: Self-pay

## 2022-05-31 ENCOUNTER — Ambulatory Visit (HOSPITAL_COMMUNITY)
Admission: EM | Admit: 2022-05-31 | Discharge: 2022-05-31 | Disposition: A | Discharge status: Home / Self Care | Payer: BC Managed Care – PPO

## 2022-05-31 DIAGNOSIS — S0181XD Laceration without foreign body of other part of head, subsequent encounter: Secondary | ICD-10-CM | POA: Diagnosis not present

## 2022-05-31 DIAGNOSIS — Z5189 Encounter for other specified aftercare: Secondary | ICD-10-CM

## 2022-05-31 DIAGNOSIS — Z4802 Encounter for removal of sutures: Secondary | ICD-10-CM

## 2022-05-31 DIAGNOSIS — S41111D Laceration without foreign body of right upper arm, subsequent encounter: Secondary | ICD-10-CM

## 2022-05-31 NOTE — ED Triage Notes (Signed)
Pt is here for suture removal from right upper arm and left side of scalp
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
## 2022-05-31 NOTE — Discharge Instructions (Signed)
Return if needed

## 2022-08-23 ENCOUNTER — Ambulatory Visit (INDEPENDENT_AMBULATORY_CARE_PROVIDER_SITE_OTHER): Payer: BC Managed Care – PPO | Admitting: Podiatry

## 2022-08-23 ENCOUNTER — Encounter: Payer: Self-pay | Admitting: Podiatry

## 2022-08-23 DIAGNOSIS — B353 Tinea pedis: Secondary | ICD-10-CM

## 2022-08-23 DIAGNOSIS — L84 Corns and callosities: Secondary | ICD-10-CM | POA: Diagnosis not present

## 2022-08-23 MED ORDER — TERBINAFINE HCL 250 MG PO TABS: 250.0000 mg | ORAL_TABLET | Freq: Every day | ORAL | 0 refills | Status: DC

## 2022-08-23 MED ORDER — AMOXICILLIN-POT CLAVULANATE 875-125 MG PO TABS: 1.0000 | ORAL_TABLET | Freq: Two times a day (BID) | ORAL | 0 refills | Status: DC

## 2022-08-23 MED ORDER — CLINDAMYCIN PHOSPHATE 1 % EX GEL: Freq: Two times a day (BID) | CUTANEOUS | 0 refills | Status: DC

## 2022-08-24 ENCOUNTER — Encounter: Payer: Self-pay | Admitting: Podiatry

## 2022-08-24 ENCOUNTER — Telehealth: Payer: Self-pay

## 2022-08-24 NOTE — Telephone Encounter (Signed)
New letter was created

## 2022-08-26 ENCOUNTER — Encounter: Payer: Self-pay | Admitting: Podiatry

## 2022-08-26 NOTE — Progress Notes (Signed)
  Subjective:  Patient ID: Jon Schneider, male    DOB: [birth year],  MRN: [id number]  Chief Complaint  Patient presents with   Tinea Pedis    L pinky toe burning, hard to put weight on it    52 y.o. male presents with the above complaint. History confirmed with patient.  He returns for follow-up and started to get better but started to worsen again recently  Objective:  Physical Exam: warm, good capillary refill, no trophic changes or ulcerative lesions, normal DP and PT pulses, normal sensory exam, and interdigital tinea pedis with heloma molle on fourth interspace left foot.  No deep skin breakdown or ulceration  Assessment:   1. Tinea pedis, left   2. Heloma molle      Plan:  Patient was evaluated and treated and all questions answered.  Discussed the etiology and treatment options for tinea pedis.  Discussed topical and oral treatment.  I do think he has some bacterial superinfection.  Recommended treatment with oral terbinafine and Augmentin both were sent to pharmacy.  Discussed foot hygiene and use of antifungal spray.  Rx for clindamycin also was sent to the patient's pharmacy.  Also discussed appropriate foot hygiene, use of antifungal spray such as Tinactin in shoes, as well as cleaning her foot surfaces such as showers and bathroom floors with bleach.  We discussed the option of a syndactylization if this continues to recur or does not improve.  He will follow-up with me again in 8 weeks for reevaluation.  At that point if not improving would recommend surgical intervention.  Note was written for him to be able to stop and change the dressing on his foot every 2-3 hours as needed at work  Return in about 8 weeks (around 10/18/2022) for check left foot callus / tinea .

## 2022-09-18 ENCOUNTER — Emergency Department (HOSPITAL_COMMUNITY)
Admission: EM | Admit: 2022-09-18 | Discharge: 2022-09-18 | Disposition: A | Discharge status: Home / Self Care | Payer: BC Managed Care – PPO | Attending: Emergency Medicine | Admitting: Emergency Medicine

## 2022-09-18 ENCOUNTER — Emergency Department (HOSPITAL_COMMUNITY): Payer: BC Managed Care – PPO

## 2022-09-18 DIAGNOSIS — Y92410 Unspecified street and highway as the place of occurrence of the external cause: Secondary | ICD-10-CM | POA: Insufficient documentation

## 2022-09-18 DIAGNOSIS — R519 Headache, unspecified: Secondary | ICD-10-CM | POA: Insufficient documentation
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
## 2022-09-18 NOTE — Discharge Instructions (Addendum)
CT scans without acute findings.
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
## 2022-09-18 NOTE — ED Notes (Signed)
Patient has been sleeping soundly in the hallway
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
## 2022-09-21 ENCOUNTER — Ambulatory Visit (HOSPITAL_COMMUNITY)
Admission: EM | Admit: 2022-09-21 | Discharge: 2022-09-21 | Disposition: A | Discharge status: Home / Self Care | Payer: BC Managed Care – PPO | Attending: Family Medicine | Admitting: Family Medicine

## 2022-09-21 ENCOUNTER — Other Ambulatory Visit: Payer: Self-pay

## 2022-09-21 ENCOUNTER — Encounter (HOSPITAL_COMMUNITY): Payer: Self-pay | Admitting: Emergency Medicine

## 2022-09-21 DIAGNOSIS — M542 Cervicalgia: Secondary | ICD-10-CM | POA: Diagnosis not present
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
## 2022-09-21 NOTE — Discharge Instructions (Signed)
See work note

## 2022-10-18 ENCOUNTER — Ambulatory Visit (INDEPENDENT_AMBULATORY_CARE_PROVIDER_SITE_OTHER): Payer: BC Managed Care – PPO | Admitting: Podiatry

## 2022-10-18 DIAGNOSIS — Z91199 Patient's noncompliance with other medical treatment and regimen due to unspecified reason: Secondary | ICD-10-CM

## 2022-10-21 ENCOUNTER — Encounter: Payer: Self-pay | Admitting: Podiatry

## 2022-10-21 NOTE — Progress Notes (Signed)
Patient was no-show for appointment today 

## 2022-11-28 ENCOUNTER — Ambulatory Visit: Payer: BC Managed Care – PPO | Admitting: Nurse Practitioner

## 2023-04-01 ENCOUNTER — Other Ambulatory Visit: Payer: Self-pay

## 2023-04-01 ENCOUNTER — Encounter (HOSPITAL_COMMUNITY): Payer: Self-pay | Admitting: *Deleted

## 2023-04-01 ENCOUNTER — Emergency Department (HOSPITAL_COMMUNITY): Payer: BC Managed Care – PPO

## 2023-04-01 ENCOUNTER — Emergency Department (HOSPITAL_COMMUNITY)
Admission: EM | Admit: 2023-04-01 | Discharge: 2023-04-02 | Disposition: A | Discharge status: Home / Self Care | Payer: BC Managed Care – PPO | Attending: Emergency Medicine | Admitting: Emergency Medicine

## 2023-04-01 DIAGNOSIS — F10920 Alcohol use, unspecified with intoxication, uncomplicated: Secondary | ICD-10-CM | POA: Insufficient documentation

## 2023-04-01 DIAGNOSIS — R079 Chest pain, unspecified: Secondary | ICD-10-CM | POA: Diagnosis present

## 2023-04-01 DIAGNOSIS — R0789 Other chest pain: Secondary | ICD-10-CM | POA: Insufficient documentation

## 2023-04-01 DIAGNOSIS — Y909 Presence of alcohol in blood, level not specified: Secondary | ICD-10-CM | POA: Insufficient documentation
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
## 2023-04-02 LAB — HEPATIC FUNCTION PANEL
ALT: 20 U/L (ref 0–44)
AST: 25 U/L (ref 15–41)
Albumin: 4 g/dL (ref 3.5–5.0)
Alkaline Phosphatase: 55 U/L (ref 38–126)
Bilirubin, Direct: 0.1 mg/dL (ref 0.0–0.2)
Indirect Bilirubin: 0.7 mg/dL (ref 0.3–0.9)
Total Bilirubin: 0.8 mg/dL (ref 0.3–1.2)
Total Protein: 7.6 g/dL (ref 6.5–8.1)

## 2023-04-02 LAB — CBC
HCT: 40.2 % (ref 39.0–52.0)
Hemoglobin: 13.2 g/dL (ref 13.0–17.0)
MCH: 29.8 pg (ref 26.0–34.0)
MCHC: 32.8 g/dL (ref 30.0–36.0)
MCV: 90.7 fL (ref 80.0–100.0)
Platelets: 239 10*3/uL (ref 150–400)
RBC: 4.43 MIL/uL (ref 4.22–5.81)
RDW: 14.9 % (ref 11.5–15.5)
WBC: 9.8 10*3/uL (ref 4.0–10.5)
nRBC: 0 % (ref 0.0–0.2)

## 2023-04-02 LAB — BASIC METABOLIC PANEL
Anion gap: 11 (ref 5–15)
BUN: 13 mg/dL (ref 6–20)
CO2: 22 mmol/L (ref 22–32)
Calcium: 8.9 mg/dL (ref 8.9–10.3)
Chloride: 107 mmol/L (ref 98–111)
Creatinine, Ser: 1.4 mg/dL — ABNORMAL HIGH (ref 0.61–1.24)
GFR, Estimated: >60 mL/min (ref >60)
Glucose, Bld: 99 mg/dL (ref 70–99)
Potassium: 3.9 mmol/L (ref 3.5–5.1)
Sodium: 140 mmol/L (ref 135–145)

## 2023-04-02 LAB — TROPONIN I (HIGH SENSITIVITY)
Troponin I (High Sensitivity): 11 ng/L (ref <18)
Troponin I (High Sensitivity): 8 ng/L (ref <18)

## 2023-04-02 LAB — LIPASE, BLOOD: Lipase: 45 U/L (ref 11–51)

## 2023-04-02 MED ORDER — SODIUM CHLORIDE 0.9 % IV BOLUS
500.0000 mL | Freq: Once | INTRAVENOUS | Status: AC
  Administered 2023-04-02: 500 mL via INTRAVENOUS

## 2023-04-02 MED ORDER — ONDANSETRON 4 MG PO TBDP: 4.0000 mg | ORAL_TABLET | Freq: Three times a day (TID) | ORAL | 0 refills | Status: DC | PRN

## 2023-04-02 NOTE — ED Provider Notes (Signed)
[place] EMERGENCY DEPARTMENT AT [facility] Provider Note   CSN: [id number] Arrival date & time: 04/01/23  2313     History  Chief Complaint  Patient presents with   Chest Pain    Jon Schneider is a 52 y.o. male.  The history is provided by the patient and a relative.  Chest Pain Jon Schneider is a 52 y.o. male who presents to the Emergency Department complaining of chest pain.  He presents to the emergency department for evaluation of chest pain and difficulty breathing that started just prior to ED arrival.  He has been celebrating his birthday throughout the weekend with alcohol and marijuana.  He may have used a small amount of cocaine.  He was doing well until just prior to ED arrival when he complained of severe central chest pain with associated nausea and epigastric discomfort.  Symptoms are improving since ED arrival.  His chest pain and difficulty breathing as well as nausea have resolved.  He has no known medical problems and takes no routine medications.  No prior similar symptoms.  No vomiting, diarrhea, leg swelling or pain, abdominal pain.      Home Medications Prior to Admission medications   Medication Sig Start Date End Date Taking? Authorizing Provider  ondansetron (ZOFRAN-ODT) 4 MG disintegrating tablet Take 1 tablet (4 mg total) by mouth every 8 (eight) hours as needed. 04/02/23  Yes Tilden Fossa, MD  terbinafine (LAMISIL) 250 MG tablet Take 1 tablet (250 mg total) by mouth daily. Daily 1 tab daily for 4 weeks Patient not taking: Reported on 04/02/2023 08/23/22   Edwin Cap, DPM      Allergies    Patient has no known allergies.    Review of Systems   Review of Systems  Cardiovascular:  Positive for chest pain.  All other systems reviewed and are negative.   Physical Exam Updated Vital Signs BP (!) 143/80 (BP Location: Right Arm)   Pulse (!) 59   Temp 98.4 F (36.9 C) (Oral)   Resp 18   Ht 5\' 10"  (1.778 m)   Wt 77.1 kg    SpO2 100%   BMI 24.39 kg/m  Physical Exam Vitals and nursing note reviewed.  Constitutional:      Appearance: He is well-developed.  HENT:     Head: Normocephalic and atraumatic.  Cardiovascular:     Rate and Rhythm: Normal rate and regular rhythm.     Heart sounds: No murmur heard. Pulmonary:     Effort: Pulmonary effort is normal. No respiratory distress.     Breath sounds: Normal breath sounds.  Abdominal:     Palpations: Abdomen is soft.     Tenderness: There is no abdominal tenderness. There is no guarding or rebound.  Musculoskeletal:        General: No swelling or tenderness.  Skin:    General: Skin is warm and dry.  Neurological:     Mental Status: He is alert and oriented to person, place, and time.  Psychiatric:        Behavior: Behavior normal.     ED Results / Procedures / Treatments   Labs (all labs ordered are listed, but only abnormal results are displayed) Labs Reviewed  BASIC METABOLIC PANEL - Abnormal; Notable for the following components:      Result Value   Creatinine, Ser 1.40 (*)    All other components within normal limits  CBC  HEPATIC FUNCTION PANEL  LIPASE, BLOOD  TROPONIN I (HIGH  SENSITIVITY)  TROPONIN I (HIGH SENSITIVITY)    EKG EKG Interpretation Date/Time:  Sunday April 01 2023 23:21:27 EDT Ventricular Rate:  84 PR Interval:  157 QRS Duration:  80 QT Interval:  365 QTC Calculation: 432 R Axis:   69  Text Interpretation: Sinus rhythm Left ventricular hypertrophy ST elev, probable normal early repol pattern Confirmed by Sriansh Farra ([id number]) on 04/01/2023 11:35:48 PM  Radiology DG Chest Port 1 View  Result Date: 04/02/2023 CLINICAL DATA:  Shortness of breath EXAM: PORTABLE CHEST 1 VIEW COMPARISON:  05/22/2022 FINDINGS: The heart size and mediastinal contours are within normal limits. Both lungs are clear. The visualized skeletal structures are unremarkable. IMPRESSION: Normal study. Electronically Signed   By: Kevin  Dover  M.D.   On: 04/02/2023 00:09    Procedures Procedures    Medications Ordered in ED Medications  sodium chloride 0.9 % bolus 500 mL (0 mLs Intravenous Stopped 04/02/23 0332)    ED Course/ Medical Decision Making/ A&P                                 Medical Decision Making Amount and/or Complexity of Data Reviewed Labs: ordered. Radiology: ordered.  Risk Prescription drug management.   Patient without significant medical history here for evaluation of chest pain that started in the setting of drinking alcohol for the last 3 days.  He was celebrating his birthday.  Pain has significantly improved at time of ED evaluation.  EKG consistent with LVH and early repull.  Troponins are negative x 2.  There is borderline elevation in his creatinine, which is similar when compared to priors.  Chest x-ray is negative for acute abnormality.  Examination and history are not consistent with PE, dissection, acute abdomen, pancreatitis.  Doubt ACS.  On repeat evaluation patient continues to feel improved.  Plan to discharge home with outpatient follow-up and return precautions.        Final Clinical Impression(s) / ED Diagnoses Final diagnoses:  Atypical chest pain  Alcoholic intoxication without complication (HCC)    Rx / DC Orders ED Discharge Orders          Ordered    ondansetron (ZOFRAN-ODT) 4 MG disintegrating tablet  Every 8 hours PRN        09 /30/24 0330              Tilden Fossa, MD 04/02/23 [phone]

## 2023-04-02 NOTE — ED Notes (Signed)
Pt ambulated down the hall and back with no assistance. Gait steady.

## 2024-05-16 ENCOUNTER — Ambulatory Visit (HOSPITAL_COMMUNITY)
Admission: EM | Admit: 2024-05-16 | Discharge: 2024-05-16 | Disposition: A | Discharge status: Home / Self Care | Payer: Self-pay | Attending: Physician Assistant | Admitting: Physician Assistant

## 2024-05-16 ENCOUNTER — Encounter (HOSPITAL_COMMUNITY): Payer: Self-pay | Admitting: Emergency Medicine

## 2024-05-16 DIAGNOSIS — Z113 Encounter for screening for infections with a predominantly sexual mode of transmission: Secondary | ICD-10-CM | POA: Insufficient documentation

## 2024-05-16 NOTE — ED Provider Notes (Signed)
 [facility]    CSN: [id number] Arrival date & time: 05/16/24  1851      History   Chief Complaint Chief Complaint  Patient presents with   Exposure to STD    HPI Jon Schneider is a 53 y.o. male.  has no past medical history on file.   HPI  Discussed the use of AI scribe software for clinical note transcription with the patient, who gave verbal consent to proceed.  He presents for evaluation after being advised by his partner to get checked for a possible STD.  He has no current symptoms of an STD infection. No pain, penile discharge, or swelling in the penis or testicles. He mentions having had a small sore in the genital area that has since resolved.  His partner had symptoms and was checked for an STD, but he is unsure of her diagnosis. He declines additional blood work for HIV and syphilis at this time, noting an upcoming physical examination.  No fever, chills, or any other systemic symptoms.   History reviewed. No pertinent past medical history.  There are no active problems to display for this patient.   Past Surgical History:  Procedure Laterality Date   FOOT SURGERY Right        Home Medications    Prior to Admission medications   Not on File    Family History No family history on file.  Social History Social History   Tobacco Use   Smoking status: Every Day    Current packs/day: 0.50    Types: Cigarettes   Smokeless tobacco: Never  Substance Use Topics   Alcohol use: Yes    Comment: occasionally   Drug use: Yes    Types: Marijuana     Allergies   Patient has no known allergies.   Review of Systems Review of Systems  Constitutional:  Negative for chills and fever.  Gastrointestinal:  Negative for abdominal pain.  Genitourinary:  Negative for dysuria, genital sores, penile discharge, penile pain, penile swelling, scrotal swelling and testicular pain.  Skin:  Negative for rash.     Physical Exam Triage Vital  Signs ED Triage Vitals  Encounter Vitals Group     BP 05/16/24 2007 (!) 164/93     Girls Systolic BP Percentile --      Girls Diastolic BP Percentile --      Boys Systolic BP Percentile --      Boys Diastolic BP Percentile --      Pulse Rate 05/16/24 2007 61     Resp 05/16/24 2007 16     Temp 05/16/24 2007 98 F (36.7 C)     Temp Source 05/16/24 2007 Oral     SpO2 05/16/24 2007 97 %     Weight --      Height --      Head Circumference --      Peak Flow --      Pain Score 05/16/24 2005 0     Pain Loc --      Pain Education --      Exclude from Growth Chart --    No data found.  Updated Vital Signs BP (!) 164/93 (BP Location: Left Arm)   Pulse 61   Temp 98 F (36.7 C) (Oral)   Resp 16   SpO2 97%   Visual Acuity Right Eye Distance:   Left Eye Distance:   Bilateral Distance:    Right Eye Near:   Left Eye Near:  Bilateral Near:     Physical Exam Vitals reviewed.  Constitutional:      General: He is awake.     Appearance: Normal appearance. He is well-developed and well-groomed.  HENT:     Head: Normocephalic and atraumatic.  Eyes:     Extraocular Movements: Extraocular movements intact.     Conjunctiva/sclera: Conjunctivae normal.  Pulmonary:     Effort: Pulmonary effort is normal.  Musculoskeletal:     Cervical back: Normal range of motion.  Neurological:     Mental Status: He is alert and oriented to person, place, and time.  Psychiatric:        Attention and Perception: Attention normal.        Mood and Affect: Mood normal.        Speech: Speech normal.        Behavior: Behavior normal. Behavior is cooperative.        Thought Content: Thought content normal.        Judgment: Judgment normal.      UC Treatments / Results  Labs (all labs ordered are listed, but only abnormal results are displayed) Labs Reviewed  CYTOLOGY, (ORAL, ANAL, URETHRAL) ANCILLARY ONLY    EKG   Radiology No results found.  Procedures Procedures (including  critical care time)  Medications Ordered in UC Medications - No data to display  Initial Impression / Assessment and Plan / UC Course  I have reviewed the triage vital signs and the nursing notes.  Pertinent labs & imaging results that were available during my care of the patient were reviewed by me and considered in my medical decision making (see chart for details).      Final Clinical Impressions(s) / UC Diagnoses   Final diagnoses:  Screening examination for STD (sexually transmitted disease)   Screening for sexually transmitted infections Asymptomatic male with a partner undergoing STI testing. No current symptoms such as penile discharge, pain, swelling, or fever. Previous genital sores resolved. Partner's STI status unknown. - Performed swab for STI screening - Advised abstinence from sexual activity for two weeks - Will notify via phone if swab results are positive - Will provide treatment options based on swab results    Discharge Instructions      You were seen today for concerns for STD exposure. We have completed a cytology swab to check for gonorrhea, chlamydia, and trichomonas. We will keep you updated on these results once they are available and if any medications are indicated by this test results they will be sent in to the pharmacy on file or you will receive a call to set up an appointment for an injection here at urgent care. Please refrain from sexual activity until your test results are negative or until you have completed an appropriate medication regimen.  It is recommended that you use a condom or another barrier method to help prevent STD transmission going forward.  Please make sure that you are communicating your test results to your partners especially if there are any positive so that they can get requisite testing for themselves.      ED Prescriptions   None    PDMP not reviewed this encounter.   Marylene Rocky BRAVO, PA-C 05/16/24 2020

## 2024-05-16 NOTE — ED Triage Notes (Signed)
 Pt reports that sexual partner told him that he needed to go get checked didn't say what STD was exposed to.

## 2024-05-16 NOTE — Discharge Instructions (Signed)
 You were seen today for concerns for STD exposure. We have completed a cytology swab to check for gonorrhea, chlamydia, and trichomonas. We will keep you updated on these results once they are available and if any medications are indicated by this test results they will be sent in to the pharmacy on file or you will receive a call to set up an appointment for an injection here at urgent care. Please refrain from sexual activity until your test results are negative or until you have completed an appropriate medication regimen.  It is recommended that you use a condom or another barrier method to help prevent STD transmission going forward.  Please make sure that you are communicating your test results to your partners especially if there are any positive so that they can get requisite testing for themselves.

## 2024-05-19 ENCOUNTER — Ambulatory Visit (HOSPITAL_COMMUNITY): Payer: Self-pay

## 2024-05-19 LAB — CYTOLOGY, (ORAL, ANAL, URETHRAL) ANCILLARY ONLY
Chlamydia: NEGATIVE
Comment: NEGATIVE
Comment: NEGATIVE
Comment: NORMAL
Neisseria Gonorrhea: NEGATIVE
Trichomonas: POSITIVE — AB

## 2024-05-19 MED ORDER — METRONIDAZOLE 500 MG PO TABS: 2000.0000 mg | ORAL_TABLET | Freq: Once | ORAL | 0 refills | Status: AC
